# Patient Record
Sex: Male | Born: 1990 | Hispanic: No | Smoking: Former smoker
Health system: Southern US, Community
[De-identification: ages and names within clinical notes are randomized; demographics above are authoritative.]

---

## 2016-02-13 ENCOUNTER — Emergency Department
Admission: EM | Admit: 2016-02-13 | Discharge: 2016-02-13 | Disposition: A | Discharge status: Home / Self Care | Payer: No Typology Code available for payment source | Attending: Student in an Organized Health Care Education/Training Program | Admitting: Student in an Organized Health Care Education/Training Program

## 2016-02-13 ENCOUNTER — Emergency Department: Payer: No Typology Code available for payment source

## 2016-02-13 ENCOUNTER — Encounter: Payer: Self-pay | Admitting: Emergency Medicine

## 2016-02-13 DIAGNOSIS — Z87891 Personal history of nicotine dependence: Secondary | ICD-10-CM | POA: Diagnosis not present

## 2016-02-13 DIAGNOSIS — R0781 Pleurodynia: Secondary | ICD-10-CM | POA: Insufficient documentation

## 2016-02-13 DIAGNOSIS — R079 Chest pain, unspecified: Secondary | ICD-10-CM

## 2016-02-13 LAB — BASIC METABOLIC PANEL
ANION GAP: 6 (ref 5–15)
BUN: 14 mg/dL (ref 6–20)
CALCIUM: 9.2 mg/dL (ref 8.9–10.3)
CO2: 25 mmol/L (ref 22–32)
CREATININE: 0.97 mg/dL (ref 0.61–1.24)
Chloride: 106 mmol/L (ref 101–111)
Glucose, Bld: 105 mg/dL — ABNORMAL HIGH (ref 65–99)
Potassium: 3.4 mmol/L — ABNORMAL LOW (ref 3.5–5.1)
Sodium: 137 mmol/L (ref 135–145)

## 2016-02-13 LAB — CBC
HCT: 48 % (ref 40.0–52.0)
HEMOGLOBIN: 16.8 g/dL (ref 13.0–18.0)
MCH: 30.1 pg (ref 26.0–34.0)
MCHC: 35.1 g/dL (ref 32.0–36.0)
MCV: 85.6 fL (ref 80.0–100.0)
PLATELETS: 217 10*3/uL (ref 150–440)
RBC: 5.6 MIL/uL (ref 4.40–5.90)
RDW: 12.4 % (ref 11.5–14.5)
WBC: 8.6 10*3/uL (ref 3.8–10.6)

## 2016-02-13 LAB — TROPONIN I

## 2016-02-13 MED ORDER — CYCLOBENZAPRINE HCL 10 MG PO TABS: 10.0000 mg | ORAL_TABLET | Freq: Every day | ORAL | 0 refills | Status: AC

## 2016-02-13 MED ORDER — IOPAMIDOL (ISOVUE-370) INJECTION 76%
75.0000 mL | Freq: Once | INTRAVENOUS | Status: AC | PRN
  Administered 2016-02-13: 75 mL via INTRAVENOUS

## 2016-02-13 MED ORDER — FENTANYL CITRATE (PF) 100 MCG/2ML IJ SOLN
50.0000 ug | INTRAMUSCULAR | Status: DC | PRN
  Administered 2016-02-13: 50 ug via INTRAVENOUS
  Filled 2016-02-13: qty 2

## 2016-02-13 MED ORDER — SODIUM CHLORIDE 0.9 % IV BOLUS (SEPSIS)
1000.0000 mL | Freq: Once | INTRAVENOUS | Status: AC
  Administered 2016-02-13: 1000 mL via INTRAVENOUS

## 2016-02-13 MED ORDER — NAPROXEN 375 MG PO TABS: 375.0000 mg | ORAL_TABLET | Freq: Two times a day (BID) | ORAL | 0 refills | Status: AC

## 2016-02-13 MED ORDER — IPRATROPIUM-ALBUTEROL 0.5-2.5 (3) MG/3ML IN SOLN
3.0000 mL | Freq: Once | RESPIRATORY_TRACT | Status: AC
  Administered 2016-02-13: 3 mL via RESPIRATORY_TRACT
  Filled 2016-02-13: qty 3

## 2016-02-13 NOTE — ED Provider Notes (Signed)
[facility] Emergency Department Provider Note    First MD Initiated Contact with Patient 02/13/16 [phone]     (approximate)  I have reviewed the triage vital signs and the nursing notes.   HISTORY  Chief Complaint Chest Pain    HPI Bradley Freeman is a 25 y.o. male previously healthy young male with a positive smoking history presents with acute left-sided pleuritic chest pain that started around 6 AM. States that the pain woke him from sleep. He denies any orthopnea or shortness of breath at this time. No cough. No nausea or vomiting. No pain radiating to his jaw or right shoulder. No pain radiating to his back. No lower STEMI swelling. 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   History reviewed. No pertinent past medical history. No fam h/o blod clots or bleeding disorders History reviewed. No pertinent surgical history. There are no active problems to display for this patient.     Prior to Admission medications   Medication Sig Start Date End Date Taking? Authorizing Provider  cyclobenzaprine (FLEXERIL) 10 MG tablet Take 1 tablet (10 mg total) by mouth at bedtime. 02/13/16   Willy EddyPatrick Kendre Sires, MD  naproxen (NAPROSYN) 375 MG tablet Take 1 tablet (375 mg total) by mouth 2 (two) times daily with a meal. 02/13/16 02/23/16  Willy EddyPatrick Renji Berwick, MD    Allergies Patient has no known allergies.    Social History Social History  Substance Use Topics  . Smoking status: Former Games developermoker  . Smokeless tobacco: Never Used  . Alcohol use No    Review of Systems Patient denies headaches, rhinorrhea, blurry vision, numbness, shortness of breath, chest pain, edema, cough, abdominal pain, nausea, vomiting, diarrhea, dysuria, fevers, rashes or hallucinations unless otherwise stated above in HPI. ____________________________________________   PHYSICAL EXAM:  VITAL SIGNS: Vitals:   02/13/16 0714 02/13/16 0925  BP: (!) 142/78 126/70    Pulse:  77  Resp:  15  Temp:      Constitutional: Alert and oriented. Well appearing and in no acute distress. Eyes: Conjunctivae are normal. PERRL. EOMI. Head: Atraumatic. Nose: No congestion/rhinnorhea. Mouth/Throat: Mucous membranes are moist.  Oropharynx non-erythematous. Neck: No stridor. Painless ROM. No cervical spine tenderness to palpation Hematological/Lymphatic/Immunilogical: No cervical lymphadenopathy. Cardiovascular: Normal rate, regular rhythm. Grossly normal heart sounds.  Good peripheral circulation. Respiratory: Normal respiratory effort.  No retractions. Lungs CTAB. Gastrointestinal: Soft and nontender. No distention. No abdominal bruits. No CVA tenderness. Genitourinary:  Musculoskeletal: No lower extremity tenderness nor edema.  No joint effusions. Neurologic:  Normal speech and language. No gross focal neurologic deficits are appreciated. No gait instability. Skin:  Skin is warm, dry and intact. No rash noted. Psychiatric: Mood and affect are normal. Speech and behavior are normal.  ____________________________________________   LABS (all labs ordered are listed, but only abnormal results are displayed)  Results for orders placed or performed during the hospital encounter of 02/13/16 (from the past 24 hour(s))  Basic metabolic panel     Status: Abnormal   Collection Time: 02/13/16  7:06 AM  Result Value Ref Range   Sodium 137 135 - 145 mmol/L   Potassium 3.4 (L) 3.5 - 5.1 mmol/L   Chloride 106 101 - 111 mmol/L   CO2 25 22 - 32 mmol/L   Glucose, Bld 105 (H) 65 - 99 mg/dL   BUN 14 6 - 20 mg/dL   Creatinine, Ser 1.910.97 0.61 - 1.24 mg/dL   Calcium 9.2 8.9 - 47.810.3 mg/dL  GFR calc non Af Amer >60 >60 mL/min   GFR calc Af Amer >60 >60 mL/min   Anion gap 6 5 - 15  CBC     Status: None   Collection Time: 02/13/16  7:06 AM  Result Value Ref Range   WBC 8.6 3.8 - 10.6 K/uL   RBC 5.60 4.40 - 5.90 MIL/uL   Hemoglobin 16.8 13.0 - 18.0 g/dL   HCT 16.148.0 09.640.0 - 04.552.0  %   MCV 85.6 80.0 - 100.0 fL   MCH 30.1 26.0 - 34.0 pg   MCHC 35.1 32.0 - 36.0 g/dL   RDW 40.912.4 81.111.5 - 91.414.5 %   Platelets 217 150 - 440 K/uL  Troponin I     Status: None   Collection Time: 02/13/16  7:06 AM  Result Value Ref Range   Troponin I <0.03 <0.03 ng/mL   ____________________________________________  EKG My review and personal interpretation at Time: 7:09   Indication: chest pain  Rate: 60  Rhythm: sinus Axis: normal Other: no acute st elevations, borderline prolonged PR ____________________________________________  RADIOLOGY  I personally reviewed all radiographic images ordered to evaluate for the above acute complaints and reviewed radiology reports and findings.  These findings were personally discussed with the patient.  Please see medical record for radiology report.  ____________________________________________   PROCEDURES  Procedure(s) performed: none Procedures    Critical Care performed: no ____________________________________________   INITIAL IMPRESSION / ASSESSMENT AND PLAN / ED COURSE  Pertinent labs & imaging results that were available during my care of the patient were reviewed by me and considered in my medical decision making (see chart for details).  DDX: ACS, pericarditis, esophagitis, boerhaaves, pe, dissection, pna, bronchitis, costochondritis   Bradley Freeman is a 25 y.o. who presents to the ED with chest pain as described above. Patient is afebrile and hemodynamically stable. Less consistent with ACS based on history of presentation with a nonischemic EKG and a negative troponin. Patient low risk heart score. Possible component of bronchitis. Less consistent with pneumonia as is afebrile with a chest x-ray. Patient is moderate risk by well's criteria will further risk stratify to evaluate for pulmonary embolism with CT angiogram.  The patient will be placed on continuous pulse oximetry and telemetry for monitoring.  Laboratory evaluation  will be sent to evaluate for the above complaints.     Clinical Course as of Feb 12 1506  Thu Feb 13, 2016  [id number] CT imaging without evidence of pulmonary embolism or pneumonia. Presentation possible secondary to bronchitis or muscle skeletal pain.  [PR]  [id number] Patient remains chest pain-free at this time. Consistent with ACS. No evidence of infectious process. Patient stable for outpatient follow-up.  [PR]    Clinical Course User Index [PR] Willy EddyPatrick Rohan Juenger, MD     ____________________________________________   FINAL CLINICAL IMPRESSION(S) / ED DIAGNOSES  Final diagnoses:  Chest pain, unspecified type      NEW MEDICATIONS STARTED DURING THIS VISIT:  Discharge Medication List as of 02/13/2016  9:02 AM    START taking these medications   Details  cyclobenzaprine (FLEXERIL) 10 MG tablet Take 1 tablet (10 mg total) by mouth at bedtime., Starting Thu 02/13/2016, Print    naproxen (NAPROSYN) 375 MG tablet Take 1 tablet (375 mg total) by mouth 2 (two) times daily with a meal., Starting Thu 02/13/2016, Until Sun 02/23/2016, Print         Note:  This document was prepared using Dragon voice recognition software and may include unintentional dictation  errors.    Willy Eddy, MD 02/13/16 [phone]

## 2016-02-13 NOTE — ED Triage Notes (Signed)
Pt here via [facility] with c/o CP that started around 0600. Pt reports it woke him up from his sleep. Pt reports increased pain upon inspiration. Pt is truck driver.

## 2016-02-13 NOTE — ED Notes (Signed)
Patient transported to CT 

## 2017-04-11 IMAGING — CT CT ANGIO CHEST
2 of 6 series · 18 of 46 positions shown · IV contrast (APPLIED)
Comparison: Chest radiograph February 13, 2016

CLINICAL DATA: Pleuritic chest pain.  Long distance truck driving

EXAM:
CT ANGIOGRAPHY CHEST WITH CONTRAST
TECHNIQUE: Multidetector CT imaging of the chest was performed using the
standard protocol during bolus administration of intravenous
contrast. Multiplanar CT image reconstructions and MIPs were
obtained to evaluate the vascular anatomy.
CONTRAST:  75 mL Isovue 370 nonionic

[Series 5: thins · axial · 0.77mm/px · z∈[-770,-477]mm · 16 of 321 slices shown]
[im 14/321  lung]
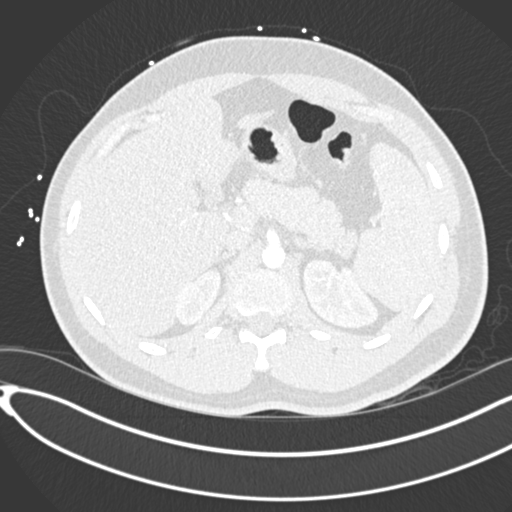
[im 42/321  soft-tissue]
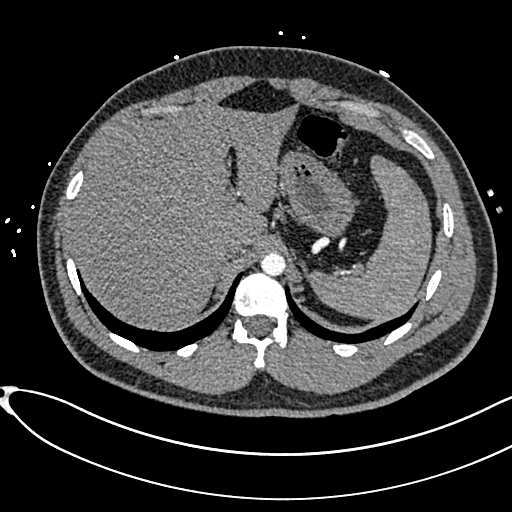
[im 56/321  lung]
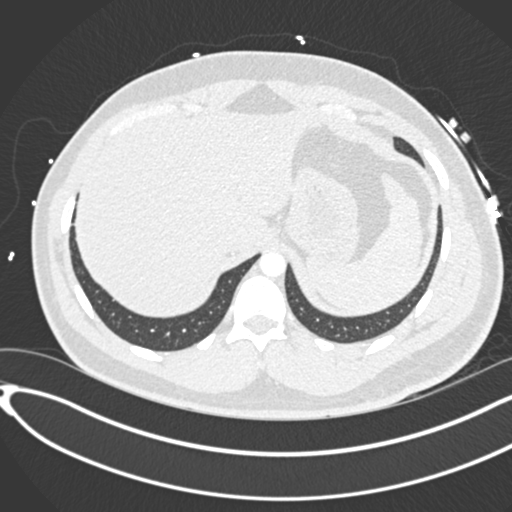
[im 70/321  soft-tissue]
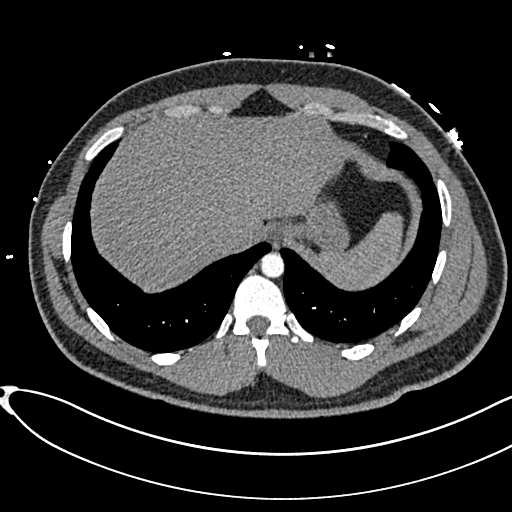
[im 98/321  lung]
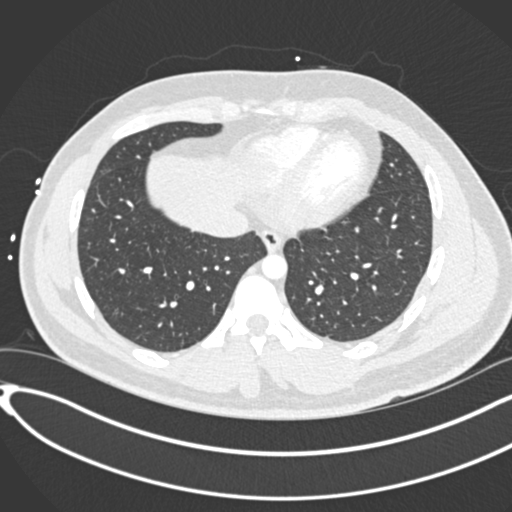
[im 112/321  soft-tissue]
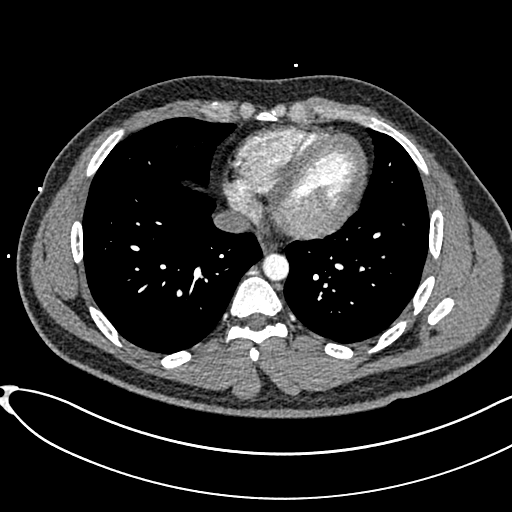
[im 126/321  lung]
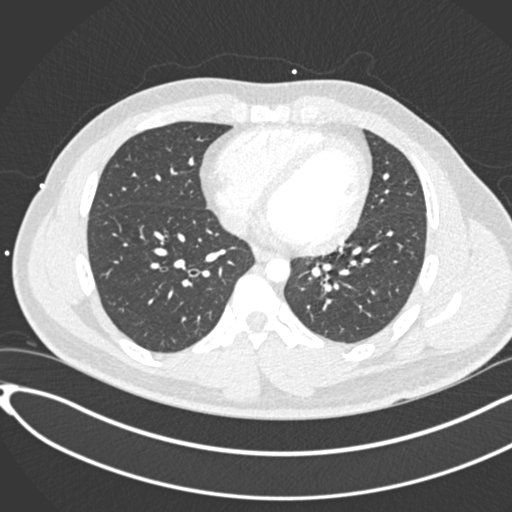
[im 154/321  soft-tissue]
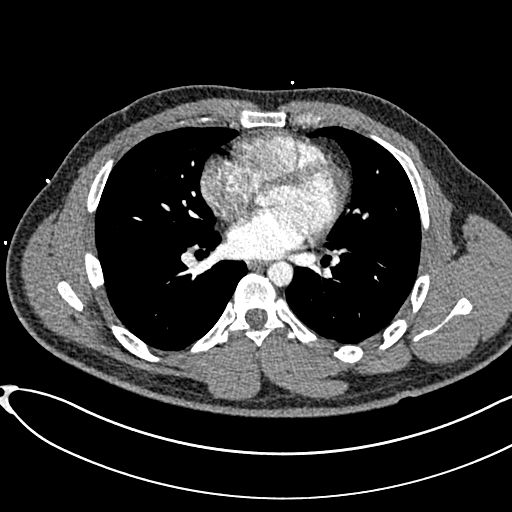
[im 167/321  lung]
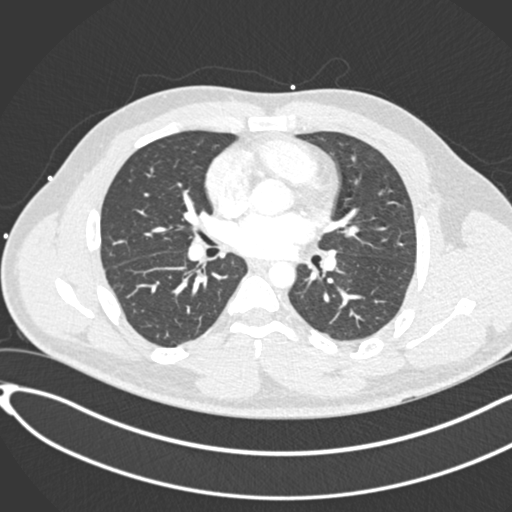
[im 195/321  soft-tissue]
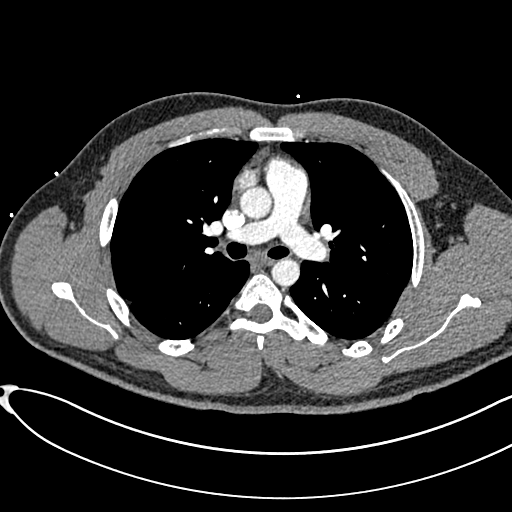
[im 209/321  lung]
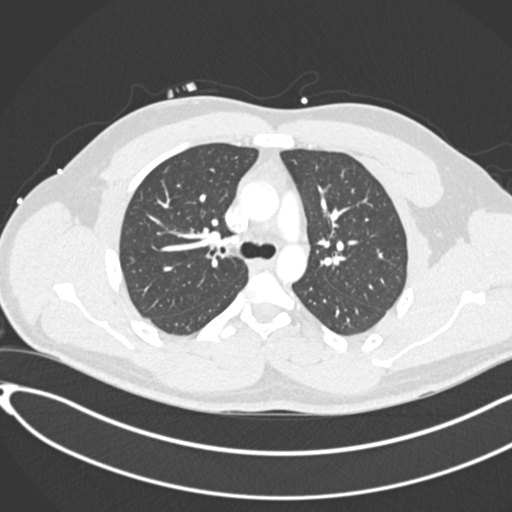
[im 223/321  soft-tissue]
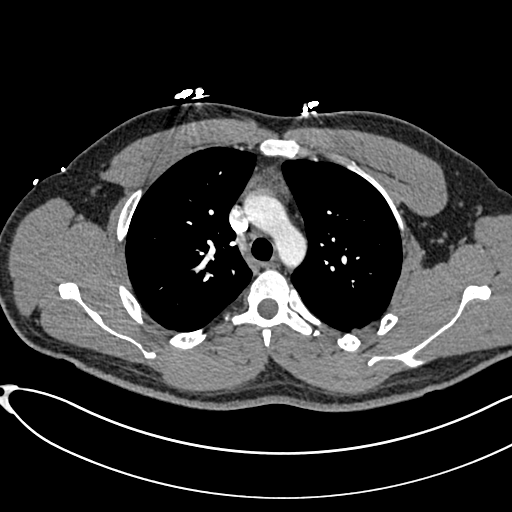
[im 251/321  lung]
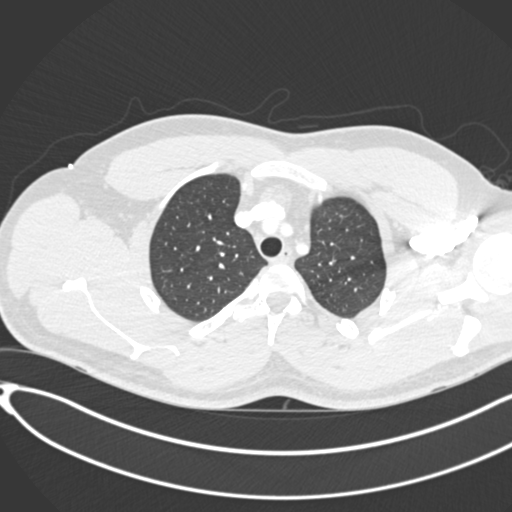
[im 265/321  soft-tissue]
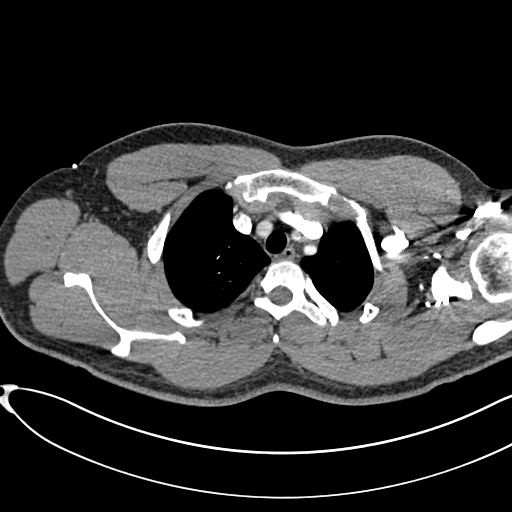
[im 279/321  lung]
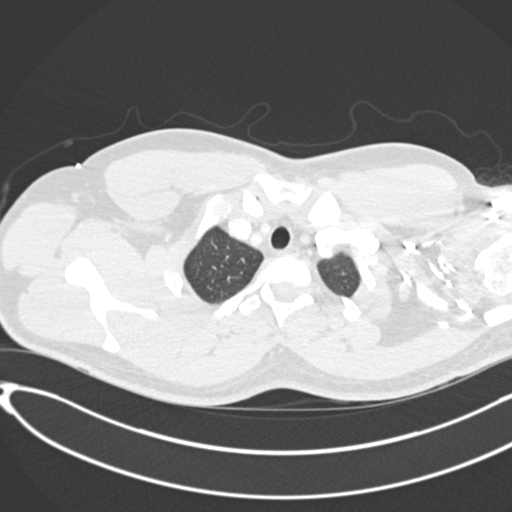
[im 307/321  soft-tissue]
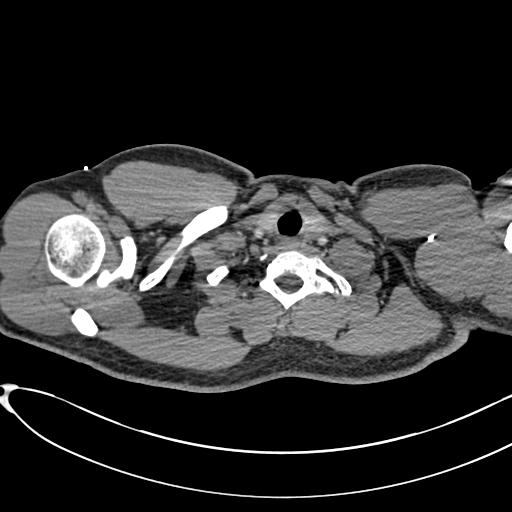

[Series 7: coronal mpr · coronal · 0.64mm/px · 2 of 91 slices shown]
[im 31/91  soft-tissue]
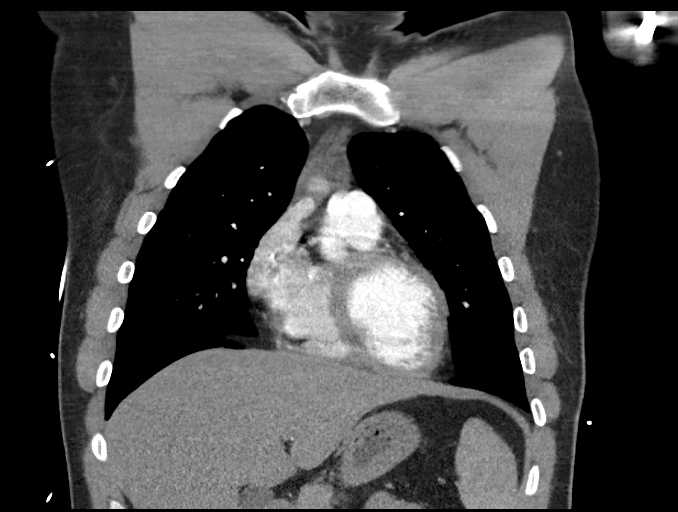
[im 61/91  soft-tissue]
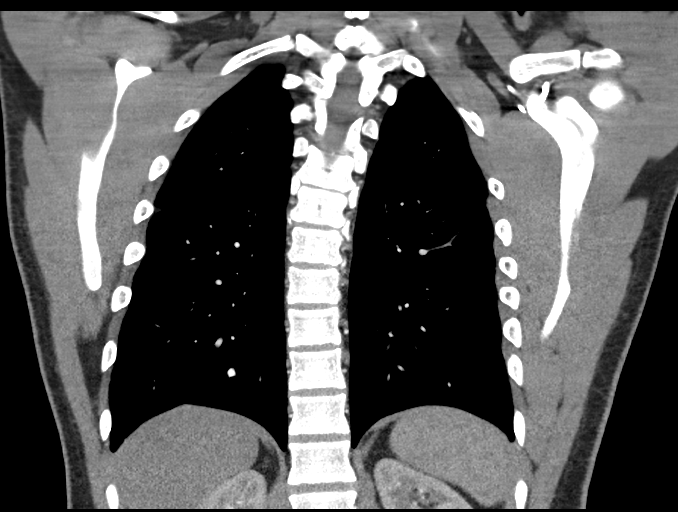

[18 of 46 positions shown; findings below may reference images not displayed]

FINDINGS: Cardiovascular: There is no demonstrable pulmonary embolus. There is
no thoracic aortic aneurysm or dissection. The visualized great
vessels appear normal. Note that the right and left common carotid
arteries arise as a common trunk, an anatomic variant. There is no
appreciable pericardial thickening.

Mediastinum/Nodes: Thyroid appears unremarkable. There is no evident
thoracic adenopathy. A small amount of thymic tissue remains which
is normal for this age group.

Lungs/Pleura: There is no parenchymal lung edema or consolidation.
No pleural effusion or pleural thickening evident. There is slight
lower lobe subsegmental atelectasis bilaterally.

Upper Abdomen: Visualized upper abdominal structures appear
unremarkable.

Musculoskeletal: There are no blastic or lytic bone lesions.

Review of the MIP images confirms the above findings.
IMPRESSION: No demonstrable pulmonary embolus. No lung edema or consolidation.
No evident adenopathy. No thoracic aortic lesion evident. In
particular, no thoracic aortic aneurysm or dissection appreciable.
